# Patient Record
Sex: Male | Born: 1975 | Race: Black or African American | Hispanic: No | Marital: Single | Smoking: Never smoker
Health system: Southern US, Community
[De-identification: ages and names within clinical notes are randomized; demographics above are authoritative.]

## PROBLEM LIST (undated history)

## (undated) DIAGNOSIS — S82899A Other fracture of unspecified lower leg, initial encounter for closed fracture: Secondary | ICD-10-CM

## (undated) DIAGNOSIS — K219 Gastro-esophageal reflux disease without esophagitis: Secondary | ICD-10-CM

## (undated) DIAGNOSIS — J45909 Unspecified asthma, uncomplicated: Secondary | ICD-10-CM

## (undated) HISTORY — PX: NO PAST SURGERIES: SHX2092

---

## 1998-08-06 ENCOUNTER — Inpatient Hospital Stay (HOSPITAL_COMMUNITY): Admission: EM | Admit: 1998-08-06 | Discharge: 1998-08-08 | Payer: Self-pay | Admitting: Emergency Medicine

## 1998-08-06 ENCOUNTER — Encounter: Payer: Self-pay | Admitting: Emergency Medicine

## 2015-03-12 ENCOUNTER — Emergency Department (HOSPITAL_COMMUNITY)
Admission: EM | Admit: 2015-03-12 | Discharge: 2015-03-12 | Disposition: A | Discharge status: Home / Self Care | Payer: Self-pay | Attending: Emergency Medicine | Admitting: Emergency Medicine

## 2015-03-12 ENCOUNTER — Encounter (HOSPITAL_COMMUNITY): Payer: Self-pay

## 2015-03-12 ENCOUNTER — Emergency Department (HOSPITAL_COMMUNITY): Payer: Self-pay

## 2015-03-12 DIAGNOSIS — S82491A Other fracture of shaft of right fibula, initial encounter for closed fracture: Secondary | ICD-10-CM | POA: Insufficient documentation

## 2015-03-12 DIAGNOSIS — S82891A Other fracture of right lower leg, initial encounter for closed fracture: Secondary | ICD-10-CM

## 2015-03-12 DIAGNOSIS — J45909 Unspecified asthma, uncomplicated: Secondary | ICD-10-CM | POA: Insufficient documentation

## 2015-03-12 DIAGNOSIS — W1839XA Other fall on same level, initial encounter: Secondary | ICD-10-CM | POA: Insufficient documentation

## 2015-03-12 DIAGNOSIS — Z79899 Other long term (current) drug therapy: Secondary | ICD-10-CM | POA: Insufficient documentation

## 2015-03-12 DIAGNOSIS — Y9339 Activity, other involving climbing, rappelling and jumping off: Secondary | ICD-10-CM | POA: Insufficient documentation

## 2015-03-12 DIAGNOSIS — S82391A Other fracture of lower end of right tibia, initial encounter for closed fracture: Secondary | ICD-10-CM | POA: Insufficient documentation

## 2015-03-12 DIAGNOSIS — Y9289 Other specified places as the place of occurrence of the external cause: Secondary | ICD-10-CM | POA: Insufficient documentation

## 2015-03-12 DIAGNOSIS — S82899A Other fracture of unspecified lower leg, initial encounter for closed fracture: Secondary | ICD-10-CM

## 2015-03-12 DIAGNOSIS — Y998 Other external cause status: Secondary | ICD-10-CM | POA: Insufficient documentation

## 2015-03-12 HISTORY — DX: Unspecified asthma, uncomplicated: J45.909

## 2015-03-12 HISTORY — DX: Other fracture of unspecified lower leg, initial encounter for closed fracture: S82.899A

## 2015-03-12 MED ORDER — OXYCODONE-ACETAMINOPHEN 5-325 MG PO TABS: 1.0000 | ORAL_TABLET | ORAL | Status: DC | PRN

## 2015-03-12 MED ORDER — SODIUM CHLORIDE 0.9 % IV SOLN
INTRAVENOUS | Status: DC
  Administered 2015-03-12: 15:00:00 via INTRAVENOUS

## 2015-03-12 MED ORDER — PROPOFOL 10 MG/ML IV BOLUS
0.5000 mg/kg | Freq: Once | INTRAVENOUS | Status: AC
  Administered 2015-03-12: 30 mg via INTRAVENOUS
  Filled 2015-03-12: qty 20

## 2015-03-12 MED ORDER — FENTANYL CITRATE (PF) 100 MCG/2ML IJ SOLN
100.0000 ug | Freq: Once | INTRAMUSCULAR | Status: AC
  Administered 2015-03-12: 100 ug via INTRAVENOUS
  Filled 2015-03-12: qty 2

## 2015-03-12 MED ORDER — KETAMINE HCL 10 MG/ML IJ SOLN
1.0000 mg/kg | Freq: Once | INTRAMUSCULAR | Status: AC
  Administered 2015-03-12: 60 mg via INTRAVENOUS
  Filled 2015-03-12: qty 10.9

## 2015-03-12 MED ORDER — ONDANSETRON HCL 4 MG/2ML IJ SOLN
4.0000 mg | Freq: Once | INTRAMUSCULAR | Status: AC
  Administered 2015-03-12: 4 mg via INTRAVENOUS
  Filled 2015-03-12: qty 2

## 2015-03-12 NOTE — ED Notes (Signed)
Bed: WA06 Expected date:  Expected time:  Means of arrival:  Comments: Ems fx ankle

## 2015-03-12 NOTE — ED Notes (Signed)
Verbalized understanding discharge instructions and referral.  In no acute distress.

## 2015-03-12 NOTE — ED Notes (Signed)
Pt verbalized approval to cut jeans.

## 2015-03-12 NOTE — ED Provider Notes (Signed)
CSN: [id number]     Arrival date & time 03/12/15  1305 History   First MD Initiated Contact with Patient 03/12/15 1325     Chief Complaint  Patient presents with  . Ankle Pain     (Consider location/radiation/quality/duration/timing/severity/associated sxs/prior Treatment) HPI   40 year old male with fracture dislocation right ankle. However just before arrival. Patient was jumping over a fence when he thinks his right foot got caught near the top and he subsequently fell. Denies any significant injuries/pain aside from his ankle. No head or neck or back pain. No numbness or tingling. No blood thinners. Received 100 g of fentanyl prior to arrival.  Past Medical History  Diagnosis Date  . Asthma    History reviewed. No pertinent past surgical history. History reviewed. No pertinent family history. Social History  Substance Use Topics  . Smoking status: Never Smoker   . Smokeless tobacco: None  . Alcohol Use: No    Review of Systems  All systems reviewed and negative, other than as noted in HPI.   Allergies  Review of patient's allergies indicates no known allergies.  Home Medications   Prior to Admission medications   Medication Sig Start Date End Date Taking? Authorizing Provider  albuterol (PROVENTIL HFA;VENTOLIN HFA) 108 (90 Base) MCG/ACT inhaler Inhale 2 puffs into the lungs every 6 (six) hours as needed for wheezing or shortness of breath.   Yes Historical Provider, MD  loratadine (CLARITIN) 10 MG tablet Take 10 mg by mouth daily.   Yes Historical Provider, MD   BP 137/91 mmHg  Pulse 65  Temp(Src) 97.8 F (36.6 C) (Oral)  Resp 18  Ht 5\' 8"  (1.727 m)  Wt 240 lb (108.863 kg)  BMI 36.50 kg/m2  SpO2 96% Physical Exam  Constitutional: He appears well-developed and well-nourished. No distress.  HENT:  Head: Normocephalic and atraumatic.  Eyes: Conjunctivae are normal. Right eye exhibits no discharge. Left eye exhibits no discharge.  Neck: Neck supple.   Cardiovascular: Normal rate, regular rhythm and normal heart sounds.  Exam reveals no gallop and no friction rub.   No murmur heard. Pulmonary/Chest: Effort normal and breath sounds normal. No respiratory distress.  Abdominal: Soft. He exhibits no distension. There is no tenderness.  Musculoskeletal: He exhibits no edema or tenderness.  Deformity R ankle. Foot displaced laterally and with some external rotation. Skin taunt over area of medial distal tibia. No open areas. Good DP pulse. Good cap refill in toes. Sensation intact to light touch. No proximal tib/fib tenderness.   Neurological: He is alert.  Skin: Skin is warm and dry.  Psychiatric: He has a normal mood and affect. His behavior is normal. Thought content normal.  Nursing note and vitals reviewed.   ED Course  Procedures (including critical care time)  Procedural Sedation:  Preprocedure  Pre-anesthesia/induction confirmation of laterality/correct procedure site including "time-out."  Provider confirms review of the nurses' note, allergies, medications, pertinent labs, PMH, pre-induction vital signs, pulse oximetry, pain level, and ECG (as applicable), and patient condition satisfactory for commencing with order for sedation and procedure.  Medications: Ketamine 60mg  IV           Propofol 30 mg IV  Patient tolerated procedure and procedural sedation component as expected without apparent immediate complications.  Physician confirms procedural medication orders as administered, patient was assessed by physician post-procedure, and confirms post-sedation plan of care and disposition.  Total time of sedation/monitoring: 35 minutes  Reduction of dislocation Date/Time: 3:15 PM Performed by: Raeford RazorKOHUT, Burnell Matlin Authorized by:  Raeford Razor Consent: Verbal consent obtained. Risks and benefits: risks, benefits and alternatives were discussed Consent given by: patient Required items: required blood products, implants, devices, and  special equipment available Time out: Immediately prior to procedure a "time out" was called to verify the correct patient, procedure, equipment, support staff and site/side marked as required.  Patient sedated: see above  Vitals: Vital signs were monitored during sedation. Patient tolerance: Patient tolerated the procedure well with no immediate complications. Joint: R ankle Reduction technique: traction    Labs Review Labs Reviewed - No data to display  Imaging Review No results found.   Dg Ankle 2 Views Right  03/12/2015  CLINICAL DATA:  Status post reduction of the right ankle fracture dislocation. EXAM: RIGHT ANKLE - 2 VIEW COMPARISON:  Pre reduction images FINDINGS: The talus has been reduced into near anatomic alignment tibial articular surface. There is still mild lateral subluxation of the talus measuring 3-4 mm. Medial malleolar fracture there is now only mildly displaced, 5 mm inferior and 3 mm lateral. The distal fibular fracture there is now only foreshortened by 5 mm and displaced laterally by 7 mm. There is also a posterior malleolar fracture, which is displaced superiorly by 5 mm and posteriorly by 3 mm. IMPRESSION: 1. There has been significant reduction. The talar dislocation has been reduced with only 3-4 mm of lateral subluxation persisting. Fractures have been partially reduced. Electronically Signed   By: Amie Portland M.D.   On: 03/12/2015 16:04   Dg Ankle Complete Right  03/12/2015  CLINICAL DATA:  Right all over ankle pain with swelling, majority of pain, and obvious deformity near medial malleolus today, Pt stated he was jumping over his fence and remembers landing on his back today. Pt stated he landed back first and does not remember his foot touching anything. No previous injury EXAM: RIGHT ANKLE - COMPLETE 3+ VIEW COMPARISON:  None. FINDINGS: There is a fracture dislocation of the right ankle. There fractures of the distal tibia involving the medial malleolus as well  as from the posterior lateral corner. The talus has dislocated laterally along with the distal fibula. There is an oblique non comminuted fracture of the distal fibular shaft, which is overlapped/foreshortened by 2.5 cm, with the distal fracture component also displaced anteriorly by 1 full shaft width. No other fractures. There is diffuse surrounding soft tissue swelling. The fractured margin of the medial aspect of the distal tibia indents the overlying skin. IMPRESSION: 1. Fracture dislocation of the right ankle. Talus is dislocated laterally. There are comminuted displaced fractures of the distal tibia and non comminuted mildly displaced and foreshortened fracture of the distal fibular shaft. Electronically Signed   By: Amie Portland M.D.   On: 03/12/2015 14:43   I have personally reviewed and evaluated these images and lab results as part of my medical decision-making.   EKG Interpretation None      MDM   Final diagnoses:  Fracture dislocation of ankle, right, closed, initial encounter    40 year old male with fracture dislocation of the right ankle. Closed injury. Reasonably reduced. Remains NVI. Splinted. Crutches. As needed pain medication. Orthopedic follow-up with understanding of likely surgery.     Raeford Razor, MD 03/15/15 [phone]

## 2015-03-12 NOTE — Discharge Instructions (Signed)
Cast or Splint Care °Casts and splints support injured limbs and keep bones from moving while they heal.  °HOME CARE °· Keep the cast or splint uncovered during the drying period. °¨ A plaster cast can take 24 to 48 hours to dry. °¨ A fiberglass cast will dry in less than 1 hour. °· Do not rest the cast on anything harder than a pillow for 24 hours. °· Do not put weight on your injured limb. Do not put pressure on the cast. Wait for your doctor's approval. °· Keep the cast or splint dry. °¨ Cover the cast or splint with a plastic bag during baths or wet weather. °¨ If you have a cast over your chest and belly (trunk), take sponge baths until the cast is taken off. °¨ If your cast gets wet, dry it with a towel or blow dryer. Use the cool setting on the blow dryer. °· Keep your cast or splint clean. Wash a dirty cast with a damp cloth. °· Do not put any objects under your cast or splint. °· Do not scratch the skin under the cast with an object. If itching is a problem, use a blow dryer on a cool setting over the itchy area. °· Do not trim or cut your cast. °· Do not take out the padding from inside your cast. °· Exercise your joints near the cast as told by your doctor. °· Raise (elevate) your injured limb on 1 or 2 pillows for the first 1 to 3 days. °GET HELP IF: °· Your cast or splint cracks. °· Your cast or splint is too tight or too loose. °· You itch badly under the cast. °· Your cast gets wet or has a soft spot. °· You have a bad smell coming from the cast. °· You get an object stuck under the cast. °· Your skin around the cast becomes red or sore. °· You have new or more pain after the cast is put on. °GET HELP RIGHT AWAY IF: °· You have fluid leaking through the cast. °· You cannot move your fingers or toes. °· Your fingers or toes turn blue or white or are cool, painful, or puffy (swollen). °· You have tingling or lose feeling (numbness) around the injured area. °· You have bad pain or pressure under the  cast. °· You have trouble breathing or have shortness of breath. °· You have chest pain. °  °This information is not intended to replace advice given to you by your health care provider. Make sure you discuss any questions you have with your health care provider. °  °Document Released: 04/27/2010 Document Revised: 08/28/2012 Document Reviewed: 07/04/2012 °Elsevier Interactive Patient Education ©2016 [employer] ° °

## 2015-03-12 NOTE — ED Notes (Addendum)
Per EMS-states patient was taken a short cut to pharmacy-was jumping fences-right foot got hung up on fence and fell on back-does not know how ankle got injured-obvious deformity-100 mg of fentanyl given in route-positive pulses, can move toes

## 2015-03-12 NOTE — Progress Notes (Signed)
EDCM spoke to patient at bedside. Patient confirms he does not have a pcp or insurance living in [place].  EDCM provided patient with contact infromation to [facility], informed patient of services there.  EDCM also provided patient with list of pcps who accept self pay patients, list of discount pharmacies and websites needymeds.org and GoodRX.com for medication assistance, phone number to inquire about the orange card, phone number to inquire about Medici[employer], phone number to inquire about the Affordable Care Act, financial resources in the community such as local churches, [employer], urban ministries, and dental assistance for uninsured patients.  Patient thankful for resources.  No further EDCM needs at this time. 

## 2015-03-19 ENCOUNTER — Encounter (HOSPITAL_BASED_OUTPATIENT_CLINIC_OR_DEPARTMENT_OTHER): Payer: Self-pay | Admitting: *Deleted

## 2015-03-19 ENCOUNTER — Other Ambulatory Visit: Payer: Self-pay | Admitting: Orthopedic Surgery

## 2015-03-25 ENCOUNTER — Encounter (HOSPITAL_BASED_OUTPATIENT_CLINIC_OR_DEPARTMENT_OTHER): Payer: Self-pay | Admitting: Anesthesiology

## 2015-03-25 ENCOUNTER — Ambulatory Visit (HOSPITAL_BASED_OUTPATIENT_CLINIC_OR_DEPARTMENT_OTHER): Payer: Self-pay | Admitting: Anesthesiology

## 2015-03-25 ENCOUNTER — Encounter (HOSPITAL_BASED_OUTPATIENT_CLINIC_OR_DEPARTMENT_OTHER)
Admission: RE | Disposition: A | Discharge status: Home / Self Care | Payer: Self-pay | Source: Ambulatory Visit | Attending: Orthopedic Surgery

## 2015-03-25 ENCOUNTER — Ambulatory Visit (HOSPITAL_BASED_OUTPATIENT_CLINIC_OR_DEPARTMENT_OTHER)
Admission: RE | Admit: 2015-03-25 | Discharge: 2015-03-25 | Disposition: A | Discharge status: Home / Self Care | Payer: Self-pay | Source: Ambulatory Visit | Attending: Orthopedic Surgery | Admitting: Orthopedic Surgery

## 2015-03-25 DIAGNOSIS — X58XXXA Exposure to other specified factors, initial encounter: Secondary | ICD-10-CM | POA: Insufficient documentation

## 2015-03-25 DIAGNOSIS — K219 Gastro-esophageal reflux disease without esophagitis: Secondary | ICD-10-CM | POA: Insufficient documentation

## 2015-03-25 DIAGNOSIS — S93431A Sprain of tibiofibular ligament of right ankle, initial encounter: Secondary | ICD-10-CM | POA: Insufficient documentation

## 2015-03-25 DIAGNOSIS — Z79899 Other long term (current) drug therapy: Secondary | ICD-10-CM | POA: Insufficient documentation

## 2015-03-25 DIAGNOSIS — S82851A Displaced trimalleolar fracture of right lower leg, initial encounter for closed fracture: Secondary | ICD-10-CM | POA: Insufficient documentation

## 2015-03-25 DIAGNOSIS — J45909 Unspecified asthma, uncomplicated: Secondary | ICD-10-CM | POA: Insufficient documentation

## 2015-03-25 HISTORY — PX: ORIF ANKLE FRACTURE: SHX5408

## 2015-03-25 HISTORY — DX: Gastro-esophageal reflux disease without esophagitis: K21.9

## 2015-03-25 HISTORY — DX: Other fracture of unspecified lower leg, initial encounter for closed fracture: S82.899A

## 2015-03-25 SURGERY — OPEN REDUCTION INTERNAL FIXATION (ORIF) ANKLE FRACTURE
Anesthesia: General | Site: Ankle | Laterality: Right

## 2015-03-25 MED ORDER — MIDAZOLAM HCL 2 MG/2ML IJ SOLN
1.0000 mg | INTRAMUSCULAR | Status: DC | PRN
  Administered 2015-03-25: 2 mg via INTRAVENOUS

## 2015-03-25 MED ORDER — PROPOFOL 10 MG/ML IV BOLUS
INTRAVENOUS | Status: AC
  Filled 2015-03-25: qty 20

## 2015-03-25 MED ORDER — CEFAZOLIN SODIUM-DEXTROSE 2-3 GM-% IV SOLR
INTRAVENOUS | Status: AC
  Filled 2015-03-25: qty 50

## 2015-03-25 MED ORDER — FENTANYL CITRATE (PF) 100 MCG/2ML IJ SOLN
INTRAMUSCULAR | Status: AC
  Filled 2015-03-25: qty 2

## 2015-03-25 MED ORDER — HYDROMORPHONE HCL 1 MG/ML IJ SOLN
1.0000 mg | INTRAMUSCULAR | Status: DC | PRN
  Administered 2015-03-25: 0.5 mg via INTRAVENOUS

## 2015-03-25 MED ORDER — HYDROMORPHONE HCL 1 MG/ML IJ SOLN
INTRAMUSCULAR | Status: AC
  Filled 2015-03-25: qty 1

## 2015-03-25 MED ORDER — ONDANSETRON HCL 4 MG/2ML IJ SOLN
INTRAMUSCULAR | Status: AC
  Filled 2015-03-25: qty 2

## 2015-03-25 MED ORDER — GLYCOPYRROLATE 0.2 MG/ML IJ SOLN: 0.2000 mg | Freq: Once | INTRAMUSCULAR | Status: DC | PRN

## 2015-03-25 MED ORDER — ONDANSETRON HCL 4 MG/2ML IJ SOLN
INTRAMUSCULAR | Status: DC | PRN
  Administered 2015-03-25: 4 mg via INTRAVENOUS

## 2015-03-25 MED ORDER — LIDOCAINE HCL (CARDIAC) 20 MG/ML IV SOLN
INTRAVENOUS | Status: AC
  Filled 2015-03-25: qty 5

## 2015-03-25 MED ORDER — 0.9 % SODIUM CHLORIDE (POUR BTL) OPTIME
TOPICAL | Status: DC | PRN
  Administered 2015-03-25: 200 mL

## 2015-03-25 MED ORDER — LACTATED RINGERS IV SOLN
INTRAVENOUS | Status: DC
  Administered 2015-03-25: 10:00:00 via INTRAVENOUS
  Administered 2015-03-25: 10 mL/h via INTRAVENOUS

## 2015-03-25 MED ORDER — SODIUM CHLORIDE 0.9 % IV SOLN: INTRAVENOUS | Status: DC

## 2015-03-25 MED ORDER — BUPIVACAINE-EPINEPHRINE (PF) 0.5% -1:200000 IJ SOLN
INTRAMUSCULAR | Status: DC | PRN
  Administered 2015-03-25 (×2): 10 mL via PERINEURAL

## 2015-03-25 MED ORDER — FENTANYL CITRATE (PF) 100 MCG/2ML IJ SOLN
50.0000 ug | INTRAMUSCULAR | Status: AC | PRN
  Administered 2015-03-25: 100 ug via INTRAVENOUS
  Administered 2015-03-25 (×2): 25 ug via INTRAVENOUS

## 2015-03-25 MED ORDER — LIDOCAINE HCL (CARDIAC) 20 MG/ML IV SOLN
INTRAVENOUS | Status: DC | PRN
  Administered 2015-03-25: 100 mg via INTRAVENOUS

## 2015-03-25 MED ORDER — MIDAZOLAM HCL 2 MG/2ML IJ SOLN
INTRAMUSCULAR | Status: AC
  Filled 2015-03-25: qty 2

## 2015-03-25 MED ORDER — CHLORHEXIDINE GLUCONATE 4 % EX LIQD: 60.0000 mL | Freq: Once | CUTANEOUS | Status: DC

## 2015-03-25 MED ORDER — SCOPOLAMINE 1 MG/3DAYS TD PT72: 1.0000 | MEDICATED_PATCH | Freq: Once | TRANSDERMAL | Status: DC | PRN

## 2015-03-25 MED ORDER — CEFAZOLIN SODIUM-DEXTROSE 2-3 GM-% IV SOLR
2.0000 g | INTRAVENOUS | Status: AC
  Administered 2015-03-25: 2 g via INTRAVENOUS

## 2015-03-25 MED ORDER — DEXAMETHASONE SODIUM PHOSPHATE 10 MG/ML IJ SOLN
INTRAMUSCULAR | Status: AC
  Filled 2015-03-25: qty 1

## 2015-03-25 MED ORDER — DEXAMETHASONE SODIUM PHOSPHATE 4 MG/ML IJ SOLN
INTRAMUSCULAR | Status: DC | PRN
  Administered 2015-03-25: 10 mg via INTRAVENOUS

## 2015-03-25 MED ORDER — SUCCINYLCHOLINE CHLORIDE 20 MG/ML IJ SOLN
INTRAMUSCULAR | Status: AC
  Filled 2015-03-25: qty 1

## 2015-03-25 MED ORDER — OXYCODONE HCL 5 MG PO TABS: 5.0000 mg | ORAL_TABLET | ORAL | Status: AC | PRN

## 2015-03-25 MED ORDER — ROPIVACAINE HCL 5 MG/ML IJ SOLN
INTRAMUSCULAR | Status: DC | PRN
  Administered 2015-03-25 (×2): 10 mL via PERINEURAL

## 2015-03-25 MED ORDER — PROPOFOL 10 MG/ML IV BOLUS
INTRAVENOUS | Status: DC | PRN
  Administered 2015-03-25: 300 mg via INTRAVENOUS

## 2015-03-25 SURGICAL SUPPLY — 74 items
BANDAGE ESMARK 6X9 LF (GAUZE/BANDAGES/DRESSINGS) ×1 IMPLANT
BIT DRILL 2.5X2.75 QC CALB (BIT) ×2 IMPLANT
BIT DRILL 3.5X5.5 QC CALB (BIT) ×2 IMPLANT
BLADE SURG 15 STRL LF DISP TIS (BLADE) ×1 IMPLANT
BLADE SURG 15 STRL SS (BLADE) ×1
BNDG COHESIVE 4X5 TAN STRL (GAUZE/BANDAGES/DRESSINGS) ×2 IMPLANT
BNDG COHESIVE 6X5 TAN STRL LF (GAUZE/BANDAGES/DRESSINGS) ×2 IMPLANT
BNDG ESMARK 4X9 LF (GAUZE/BANDAGES/DRESSINGS) IMPLANT
BNDG ESMARK 6X9 LF (GAUZE/BANDAGES/DRESSINGS) ×2
CANISTER SUCT 1200ML W/VALVE (MISCELLANEOUS) ×2 IMPLANT
CHLORAPREP W/TINT 26ML (MISCELLANEOUS) ×2 IMPLANT
COVER BACK TABLE 60X90IN (DRAPES) ×2 IMPLANT
CUFF TOURNIQUET SINGLE 34IN LL (TOURNIQUET CUFF) ×2 IMPLANT
DECANTER SPIKE VIAL GLASS SM (MISCELLANEOUS) IMPLANT
DRAPE EXTREMITY T 121X128X90 (DRAPE) ×2 IMPLANT
DRAPE OEC MINIVIEW 54X84 (DRAPES) ×2 IMPLANT
DRAPE U-SHAPE 47X51 STRL (DRAPES) ×2 IMPLANT
DRSG MEPITEL 4X7.2 (GAUZE/BANDAGES/DRESSINGS) ×2 IMPLANT
DRSG PAD ABDOMINAL 8X10 ST (GAUZE/BANDAGES/DRESSINGS) ×4 IMPLANT
ELECT REM PT RETURN 9FT ADLT (ELECTROSURGICAL) ×2
ELECTRODE REM PT RTRN 9FT ADLT (ELECTROSURGICAL) ×1 IMPLANT
GAUZE SPONGE 4X4 12PLY STRL (GAUZE/BANDAGES/DRESSINGS) ×2 IMPLANT
GLOVE BIO SURGEON STRL SZ8 (GLOVE) ×2 IMPLANT
GLOVE BIOGEL PI IND STRL 7.0 (GLOVE) ×1 IMPLANT
GLOVE BIOGEL PI IND STRL 8 (GLOVE) ×2 IMPLANT
GLOVE BIOGEL PI INDICATOR 7.0 (GLOVE) ×1
GLOVE BIOGEL PI INDICATOR 8 (GLOVE) ×2
GLOVE ECLIPSE 6.5 STRL STRAW (GLOVE) ×2 IMPLANT
GLOVE ECLIPSE 7.5 STRL STRAW (GLOVE) ×2 IMPLANT
GLOVE EXAM NITRILE MD LF STRL (GLOVE) ×2 IMPLANT
GOWN STRL REUS W/ TWL LRG LVL3 (GOWN DISPOSABLE) ×1 IMPLANT
GOWN STRL REUS W/ TWL XL LVL3 (GOWN DISPOSABLE) ×2 IMPLANT
GOWN STRL REUS W/TWL LRG LVL3 (GOWN DISPOSABLE) ×1
GOWN STRL REUS W/TWL XL LVL3 (GOWN DISPOSABLE) ×2
K-WIRE ACE 1.6X6 (WIRE) ×4
KWIRE ACE 1.6X6 (WIRE) ×2 IMPLANT
NEEDLE HYPO 22GX1.5 SAFETY (NEEDLE) IMPLANT
NS IRRIG 1000ML POUR BTL (IV SOLUTION) ×2 IMPLANT
PACK BASIN DAY SURGERY FS (CUSTOM PROCEDURE TRAY) ×2 IMPLANT
PAD CAST 4YDX4 CTTN HI CHSV (CAST SUPPLIES) ×1 IMPLANT
PADDING CAST ABS 4INX4YD NS (CAST SUPPLIES)
PADDING CAST ABS COTTON 4X4 ST (CAST SUPPLIES) IMPLANT
PADDING CAST COTTON 4X4 STRL (CAST SUPPLIES) ×1
PADDING CAST COTTON 6X4 STRL (CAST SUPPLIES) ×2 IMPLANT
PENCIL BUTTON HOLSTER BLD 10FT (ELECTRODE) ×2 IMPLANT
PLATE ACE 100DE 10H (Plate) ×2 IMPLANT
SANITIZER HAND PURELL 535ML FO (MISCELLANEOUS) ×2 IMPLANT
SCREW ACE CAN 4.0 40M (Screw) ×4 IMPLANT
SCREW CORTICAL 3.5MM  16MM (Screw) ×5 IMPLANT
SCREW CORTICAL 3.5MM  20MM (Screw) ×1 IMPLANT
SCREW CORTICAL 3.5MM 16MM (Screw) ×5 IMPLANT
SCREW CORTICAL 3.5MM 18MM (Screw) ×2 IMPLANT
SCREW CORTICAL 3.5MM 20MM (Screw) ×1 IMPLANT
SHEET MEDIUM DRAPE 40X70 STRL (DRAPES) ×2 IMPLANT
SLEEVE SCD COMPRESS KNEE MED (MISCELLANEOUS) ×2 IMPLANT
SPLINT FAST PLASTER 5X30 (CAST SUPPLIES) ×20
SPLINT PLASTER CAST FAST 5X30 (CAST SUPPLIES) ×20 IMPLANT
SPONGE LAP 18X18 X RAY DECT (DISPOSABLE) ×2 IMPLANT
STOCKINETTE 6  STRL (DRAPES) ×1
STOCKINETTE 6 STRL (DRAPES) ×1 IMPLANT
SUCTION FRAZIER HANDLE 10FR (MISCELLANEOUS) ×1
SUCTION TUBE FRAZIER 10FR DISP (MISCELLANEOUS) ×1 IMPLANT
SUT ETHILON 3 0 PS 1 (SUTURE) ×2 IMPLANT
SUT FIBERWIRE #2 38 T-5 BLUE (SUTURE)
SUT MNCRL AB 3-0 PS2 18 (SUTURE) ×2 IMPLANT
SUT VIC AB 0 SH 27 (SUTURE) IMPLANT
SUT VIC AB 2-0 SH 27 (SUTURE) ×1
SUT VIC AB 2-0 SH 27XBRD (SUTURE) ×1 IMPLANT
SUTURE FIBERWR #2 38 T-5 BLUE (SUTURE) IMPLANT
SYR BULB 3OZ (MISCELLANEOUS) ×2 IMPLANT
SYR CONTROL 10ML LL (SYRINGE) IMPLANT
TOWEL OR 17X24 6PK STRL BLUE (TOWEL DISPOSABLE) ×4 IMPLANT
TUBE CONNECTING 20X1/4 (TUBING) ×2 IMPLANT
UNDERPAD 30X30 (UNDERPADS AND DIAPERS) ×2 IMPLANT

## 2015-03-25 NOTE — H&P (Signed)
Ralph BjorkJason L Levy is an 40 y.o. male.   Chief Complaint: right ankle injury HPI: 40 y/o male with right ankle fracture dislocation.  Pt underwent closed reduction in the ER and presents now for ORIF.  No recent changes in his health.  Past Medical History  Diagnosis Date  . GERD (gastroesophageal reflux disease)     OTC as needed  . Asthma     prn inhaler  . Fracture dislocation of ankle 03/12/2015    right    Past Surgical History  Procedure Laterality Date  . No past surgeries      History reviewed. No pertinent family history. Social History:  reports that he has never smoked. He has never used smokeless tobacco. He reports that he does not drink alcohol or use illicit drugs.  Allergies:  Allergies  Allergen Reactions  . Shellfish Allergy Anaphylaxis    Medications Prior to Admission  Medication Sig Dispense Refill  . acetaminophen (TYLENOL) 325 MG tablet Take 650 mg by mouth every 6 (six) hours as needed.    Marland Kitchen. albuterol (PROVENTIL HFA;VENTOLIN HFA) 108 (90 Base) MCG/ACT inhaler Inhale 2 puffs into the lungs every 6 (six) hours as needed for wheezing or shortness of breath.    Marland Kitchen. ibuprofen (ADVIL,MOTRIN) 200 MG tablet Take 200 mg by mouth every 6 (six) hours as needed.    . loratadine (CLARITIN) 10 MG tablet Take 10 mg by mouth daily.    Marland Kitchen. oxyCODONE-acetaminophen (PERCOCET/ROXICET) 5-325 MG tablet Take 1-2 tablets by mouth every 4 (four) hours as needed for severe pain. 20 tablet 0    No results found for this or any previous visit (from the past 48 hour(s)). No results found.  ROS  No recent f/c/n/v/wt loss  Blood pressure 115/81, pulse 85, temperature 99.4 F (37.4 C), temperature source Oral, resp. rate 8, height 5\' 7"  (1.702 m), weight 111.642 kg (246 lb 2 oz), SpO2 99 %. Physical Exam  wn wd male in nad.  A and O x 4.  Mood and affect normal.  EOMI.  resp unlabored.  R ankle with heatlhy skin.  No lymphadenopathy.  Mild swelling.  5/5 strength in PF and DF of the toes  and ankle.  Sens to LT intact at the foot.  Assessment/Plan R ankle trimal fracture with syndesmosis disruption - to OR for ORIF.  The risks and benefits of the alternative treatment options have been discussed in detail.  The patient wishes to proceed with surgery and specifically understands risks of bleeding, infection, nerve damage, blood clots, need for additional surgery, amputation and death.   Toni ArthursHEWITT, Ralph Krist, MD 03/25/2015, 11:14 AM

## 2015-03-25 NOTE — Anesthesia Postprocedure Evaluation (Signed)
Anesthesia Post Note  Patient: Ralph Levy  Procedure(s) Performed: Procedure(s) (LRB): OPEN REDUCTION INTERNAL FIXATION (ORIF) RIGHT ANKLE TRIMALLEOLAR FRACTURE AND SYNDESMOSIS DISRUPTION  (Right)  Patient location during evaluation: PACU Anesthesia Type: General Level of consciousness: awake and alert Pain management: pain level controlled Vital Signs Assessment: post-procedure vital signs reviewed and stable Respiratory status: spontaneous breathing, nonlabored ventilation and respiratory function stable Cardiovascular status: blood pressure returned to baseline and stable Postop Assessment: no signs of nausea or vomiting Anesthetic complications: no    Last Vitals:  Filed Vitals:   03/25/15 1415 03/25/15 1430  BP: 132/87 125/86  Pulse: 93 90  Temp:    Resp: 16 15    Last Pain:  Filed Vitals:   03/25/15 1442  PainSc: 1                  Matei Magnone A

## 2015-03-25 NOTE — Discharge Instructions (Addendum)
Toni ArthursJohn Hewitt, MD [facility]  Please read the following information regarding your care after surgery.  Medications  You only need a prescription for the narcotic pain medicine (ex. oxycodone, Percocet, Norco).  All of the other medicines listed below are available over the counter. X acetominophen (Tylenol) 650 mg every 4-6 hours as you need for minor pain X oxycodone as prescribed for moderate to severe pain  Narcotic pain medicine (ex. oxycodone, Percocet, Vicodin) will cause constipation.  To prevent this problem, take the following medicines while you are taking any pain medicine. X docusate sodium (Colace) 100 mg twice a day X senna (Senokot) 2 tablets twice a day  X To help prevent blood clots, take a baby aspirin (81 mg) twice a day for a month after surgery.  You should also get up every hour while you are awake to move around.    Weight Bearing ? Bear weight when you are able on your operated leg or foot. ? Bear weight only on the heel of your operated foot in the post-op shoe. X Do not bear any weight on the operated leg or foot.  Cast / Splint / Dressing X Keep your splint or cast clean and dry.  Dont put anything (coat hanger, pencil, etc) down inside of it.  If it gets damp, use a hair dryer on the cool setting to dry it.  If it gets soaked, call the office to schedule an appointment for a cast change. ? Remove your dressing 3 days after surgery and cover the incisions with dry dressings.    After your dressing, cast or splint is removed; you may shower, but do not soak or scrub the wound.  Allow the water to run over it, and then gently pat it dry.  Swelling It is normal for you to have swelling where you had surgery.  To reduce swelling and pain, keep your toes above your nose for at least 3 days after surgery.  It may be necessary to keep your foot or leg elevated for several weeks.  If it hurts, it should be elevated.  Follow Up Call my office at  [phone] when you are discharged from the hospital or surgery center to schedule an appointment to be seen two weeks after surgery.  Call my office at [phone] if you develop a fever >101.5 F, nausea, vomiting, bleeding from the surgical site or severe pain.     Regional Anesthesia Blocks  1. Numbness or the inability to move the "blocked" extremity may last from 3-48 hours after placement. The length of time depends on the medication injected and your individual response to the medication. If the numbness is not going away after 48 hours, call your surgeon.  2. The extremity that is blocked will need to be protected until the numbness is gone and the  Strength has returned. Because you cannot feel it, you will need to take extra care to avoid injury. Because it may be weak, you may have difficulty moving it or using it. You may not know what position it is in without looking at it while the block is in effect.  3. For blocks in the legs and feet, returning to weight bearing and walking needs to be done carefully. You will need to wait until the numbness is entirely gone and the strength has returned. You should be able to move your leg and foot normally before you try and bear weight or walk. You will need someone to be with  you when you first try to ensure you do not fall and possibly risk injury.  4. Bruising and tenderness at the needle site are common side effects and will resolve in a few days.  5. Persistent numbness or new problems with movement should be communicated to the surgeon or the [place] [phone] [place] [phone]).     Post Anesthesia Home Care Instructions  Activity: Get plenty of rest for the remainder of the day. A responsible adult should stay with you for 24 hours following the procedure.  For the next 24 hours, DO NOT: -Drive a car -Paediatric nurse -Drink alcoholic beverages -Take any medication unless  instructed by your physician -Make any legal decisions or sign important papers.  Meals: Start with liquid foods such as gelatin or soup. Progress to regular foods as tolerated. Avoid greasy, spicy, heavy foods. If nausea and/or vomiting occur, drink only clear liquids until the nausea and/or vomiting subsides. Call your physician if vomiting continues.  Special Instructions/Symptoms: Your throat may feel dry or sore from the anesthesia or the breathing tube placed in your throat during surgery. If this causes discomfort, gargle with warm salt water. The discomfort should disappear within 24 hours.  If you had a scopolamine patch placed behind your ear for the management of post- operative nausea and/or vomiting:  1. The medication in the patch is effective for 72 hours, after which it should be removed.  Wrap patch in a tissue and discard in the trash. Wash hands thoroughly with soap and water. 2. You may remove the patch earlier than 72 hours if you experience unpleasant side effects which may include dry mouth, dizziness or visual disturbances. 3. Avoid touching the patch. Wash your hands with soap and water after contact with the patch.

## 2015-03-25 NOTE — Transfer of Care (Signed)
Immediate Anesthesia Transfer of Care Note  Patient: Ralph Levy  Procedure(s) Performed: Procedure(s): OPEN REDUCTION INTERNAL FIXATION (ORIF) RIGHT ANKLE TRIMALLEOLAR FRACTURE AND SYNDESMOSIS DISRUPTION  (Right)  Patient Location: PACU  Anesthesia Type:GA combined with regional for post-op pain  Level of Consciousness: sedated, lethargic and responds to stimulation  Airway & Oxygen Therapy: Patient Spontanous Breathing and Patient connected to face mask oxygen  Post-op Assessment: Report given to RN and Post -op Vital signs reviewed and stable  Post vital signs: Reviewed and stable  Last Vitals:  Filed Vitals:   03/25/15 1045 03/25/15 1304  BP: 115/81 124/76  Pulse: 85   Temp:    Resp: 8     Complications: No apparent anesthesia complications

## 2015-03-25 NOTE — Progress Notes (Signed)
Assisted Dr. Crews with right, ultrasound guided, popliteal/saphenous block. Side rails up, monitors on throughout procedure. See vital signs in flow sheet. Tolerated Procedure well. 

## 2015-03-25 NOTE — Op Note (Signed)
NAMEMarland Levy  Ralph, Levy NO.:  [id number]  MEDICAL RECORD NO.:  [id number]  LOCATION:                                 FACILITY:  PHYSICIAN:  Toni Arthurs, MD        DATE OF BIRTH:  09-02-1975  DATE OF PROCEDURE:  03/25/2015 DATE OF DISCHARGE:                              OPERATIVE REPORT   PREOPERATIVE DIAGNOSIS:  Right ankle trimalleolar fracture dislocation with syndesmosis disruption.  POSTOPERATIVE DIAGNOSIS:  Right ankle trimalleolar fracture dislocation with syndesmosis disruption.  PROCEDURE: 1. Open reduction and internal fixation of right ankle trimalleolar     fracture without fixation of posterior lip. 2. Stress examination of the right ankle under fluoroscopy. 3. AP, mortise, and lateral radiographs of the right ankle.  SURGEON:  Toni Arthurs, MD.  ANESTHESIA:  General, regional.  ESTIMATED BLOOD LOSS:  Minimal.  TOURNIQUET TIME:  65 minutes at 250 mmHg.  COMPLICATIONS:  None apparent.  DISPOSITION:  Extubated, awake, and stable to recovery.  INDICATION FOR PROCEDURE:  The patient is a 40 year old male, who fractured his right ankle approximately 2 weeks ago.  He was seen in the emergency department where a closed reduction was performed.  He presents now for open reduction and internal fixation of this unstable ankle injury.  He understands the risks and benefits, the alternative treatment options, and elects surgical treatment.  He specifically understands risks of bleeding, infection, nerve damage, blood clots, need for additional surgery, continued pain, nonunion, amputation, and death.  PROCEDURE IN DETAIL:  After preoperative consent was obtained and the correct operative site was identified, the patient was brought to the operating room and placed supine on the operating table.  General anesthesia was induced.  Preoperative antibiotics were administered. Surgical time-out was taken.  The right lower extremity was prepped and draped  in standard sterile fashion with a tourniquet around the thigh. The extremity was exsanguinated, and the tourniquet was inflated to 250 mmHg.  A longitudinal incision was then made over the lateral malleolus. Sharp dissection was carried down through the skin and subcutaneous tissue.  The fracture site was identified.  It was cleaned of all hematoma and irrigated copiously.  The fracture was reduced and held with a K-wire.  A 10 hole one-third tubular plate from the [employer] small frag set was then applied to the lateral malleolus.  It was fixed proximally and distally with unicortical screw.  A lag screw was then inserted through the plate and through the fracture site.  It was noted to have excellent purchase.  Two more screws proximally and one more screw distally were inserted.  Attention was then turned to the medial side of the ankle where a longitudinal incision was made over the medial malleolus.  Sharp dissection was carried down through skin and subcutaneous tissue.  The fracture site was identified.  It was cleaned of all hematoma and irrigated copiously.  The fracture was reduced and held with a pointed tenaculum.  AP, mortise, and lateral radiographs confirmed appropriate reduction of the medial malleolus fracture.  Fracture was then fixed with two 4-mm partially-threaded cannulated screws.  These were both noted to have excellent purchase.  AP and  lateral radiographs confirmed appropriate reduction of both fractures and appropriate position of all hardware.  At this point, a stress examination was performed under live fluoroscopy.  There was no widening evident at the ankle mortise or syndesmosis.  Final AP, mortise, and lateral radiographs confirmed appropriate reduction of medial and lateral malleolus fractures. Posterior malleolus fracture was quite comminuted and was slightly displaced but did not involve a material amount of the articular surface.  Both wounds were then  irrigated copiously.  Deep subcutaneous tissues were approximated with inverted simple sutures of 2-0 Vicryl, superficial subcutaneous tissues were approximated with inverted simple sutures of 3-0 Monocryl, skin incisions were closed with running 3-0 nylon sutures.  Sterile dressings were applied, followed by well-padded short-leg splint.  Tourniquet was released after application of the dressings at 65 minutes.  The patient was awakened from anesthesia and transported to the recovery room in stable condition.  FOLLOWUP PLAN:  The patient will be nonweightbearing on the left lower extremity.  He will take aspirin for DVT prophylaxis.  He will follow up with me in the office in 2 weeks for suture removal and conversion to a cast.  Alfredo MartinezJustin Ollis, PA-C was present and scrubbed for the duration of the case.  His assistance was essential in positioning the patient, prepping and draping, gaining and maintaining exposure, performing the operation, closing and dressing the wounds, and applying the splint.  RADIOGRAPHS:  AP, mortise, and lateral radiographs of the right ankle were obtained intraoperatively.  These showed interval reduction and fixation of the medial and lateral malleolus fractures.  Hardware was appropriately positioned of the appropriate length.  No instability was noted at the syndesmosis.     Toni ArthursJohn Shandricka Monroy, MD     JH/MEDQ  D:  03/25/2015  T:  03/25/2015  Job:  [id number]

## 2015-03-25 NOTE — Brief Op Note (Signed)
03/25/2015  1:05 PM  PATIENT:  Ralph Levy  40 y.o. male  PRE-OPERATIVE DIAGNOSIS:  RIGHT ANKLE trimal FRACTURE DISLOCATION with syndesmosis disruption  POST-OPERATIVE DIAGNOSIS:  same  Procedure(s): 1.  ORIF right ankle trimal fracture without fixation of posterior lip   2.  Stress exam of right ankle under fluoro   3.  AP, mortise and lat xrays of the right ankle  SURGEON:  Toni ArthursJohn Sheera Illingworth, MD  ASSISTANT: Alfredo MartinezJustin Ollis, PA-C  ANESTHESIA:   General, regional  EBL:  minimal   TOURNIQUET:   Total Tourniquet Time Documented: Thigh (Right) - 65 minutes Total: Thigh (Right) - 65 minutes  COMPLICATIONS:  None apparent  DISPOSITION:  Extubated, awake and stable to recovery.  DICTATION ID:  [id number]

## 2015-03-25 NOTE — Anesthesia Procedure Notes (Addendum)
Anesthesia Regional Block:  Popliteal block  Pre-Anesthetic Checklist: ,, timeout performed, Correct Patient, Correct Site, Correct Laterality, Correct Procedure, Correct Position, site marked, Risks and benefits discussed,  Surgical consent,  Pre-op evaluation,  At surgeon's request and post-op pain management  Laterality: Lower and Right  Prep: chloraprep       Needles:  Injection technique: Single-shot  Needle Type: Echogenic Needle     Needle Length: 9cm 9 cm Needle Gauge: 21 and 21 G    Additional Needles:  Procedures: ultrasound guided (picture in chart) Popliteal block Narrative:  Start time: 03/25/2015 10:10 AM End time: 03/25/2015 10:14 AM Injection made incrementally with aspirations every 5 mL.  Performed by: Personally  Anesthesiologist: Jhaden Pizzuto   Anesthesia Regional Block:  Adductor canal block  Pre-Anesthetic Checklist: ,, timeout performed, Correct Patient, Correct Site, Correct Laterality, Correct Procedure, Correct Position, site marked, Risks and benefits discussed,  Surgical consent,  Pre-op evaluation,  At surgeon's request and post-op pain management  Laterality: Lower and Right  Prep: chloraprep       Needles:  Injection technique: Single-shot  Needle Type: Echogenic Needle     Needle Length: 9cm 9 cm Needle Gauge: 21 and 21 G    Additional Needles:  Procedures: ultrasound guided (picture in chart) Adductor canal block Narrative:  Start time: 03/25/2015 10:14 AM End time: 03/25/2015 10:19 AM Injection made incrementally with aspirations every 5 mL.  Performed by: Personally  Anesthesiologist: Raziel Koenigs   Procedure Name: LMA Insertion Date/Time: 03/25/2015 11:30 AM Performed by: Gar GibbonKEETON, DENNIS S Pre-anesthesia Checklist: Patient identified, Emergency Drugs available, Suction available and Patient being monitored Patient Re-evaluated:Patient Re-evaluated prior to inductionOxygen Delivery Method: Circle System  Utilized Preoxygenation: Pre-oxygenation with 100% oxygen Intubation Type: IV induction Ventilation: Mask ventilation without difficulty LMA: LMA inserted LMA Size: 4.0 Number of attempts: 1 Airway Equipment and Method: Bite block Placement Confirmation: positive ETCO2 Tube secured with: Tape Dental Injury: Teeth and Oropharynx as per pre-operative assessment       Right Popliteal block image  Right saphenous block image

## 2015-03-25 NOTE — Anesthesia Preprocedure Evaluation (Addendum)
Anesthesia Evaluation  Patient identified by MRN, date of birth, ID band Patient awake    Reviewed: Allergy & Precautions, NPO status , Patient's Chart, lab work & pertinent test results  Airway Mallampati: I  TM Distance: >3 FB Neck ROM: Full    Dental  (+) Teeth Intact, Dental Advisory Given   Pulmonary asthma ,    breath sounds clear to auscultation       Cardiovascular  Rhythm:Regular Rate:Normal     Neuro/Psych    GI/Hepatic GERD  Medicated and Controlled,  Endo/Other    Renal/GU      Musculoskeletal   Abdominal   Peds  Hematology   Anesthesia Other Findings   Reproductive/Obstetrics                             Anesthesia Physical Anesthesia Plan  ASA: II  Anesthesia Plan: General   Post-op Pain Management: MAC Combined w/ Regional for Post-op pain   Induction: Intravenous  Airway Management Planned: LMA  Additional Equipment:   Intra-op Plan:   Post-operative Plan: Extubation in OR  Informed Consent: I have reviewed the patients History and Physical, chart, labs and discussed the procedure including the risks, benefits and alternatives for the proposed anesthesia with the patient or authorized representative who has indicated his/her understanding and acceptance.   Dental advisory given  Plan Discussed with: CRNA, Anesthesiologist and Surgeon  Anesthesia Plan Comments:         Anesthesia Quick Evaluation

## 2015-03-26 ENCOUNTER — Encounter (HOSPITAL_BASED_OUTPATIENT_CLINIC_OR_DEPARTMENT_OTHER): Payer: Self-pay | Admitting: Orthopedic Surgery

## 2015-03-30 NOTE — Addendum Note (Signed)
Addendum  created 03/30/15 1012 by Sheldon Silvanavid Hubert Derstine, MD   Modules edited: Anesthesia Blocks and Procedures, Clinical Notes   Clinical Notes:  File: [id number]; File: [id number]; File: [id number]

## 2017-05-30 IMAGING — CR DG ANKLE COMPLETE 3+V*R*
3 series · 3 of 3 positions shown · non-contrast
Comparison: None.

CLINICAL DATA: Right all over ankle pain with swelling, majority of
pain, and obvious deformity near medial malleolus today,
Pt stated he was jumping over his fence and remembers landing on his
back today. Pt stated he landed back first and does not remember his
foot touching anything.
No previous injury

EXAM:
RIGHT ANKLE - COMPLETE 3+ VIEW

[x ankle lat right]
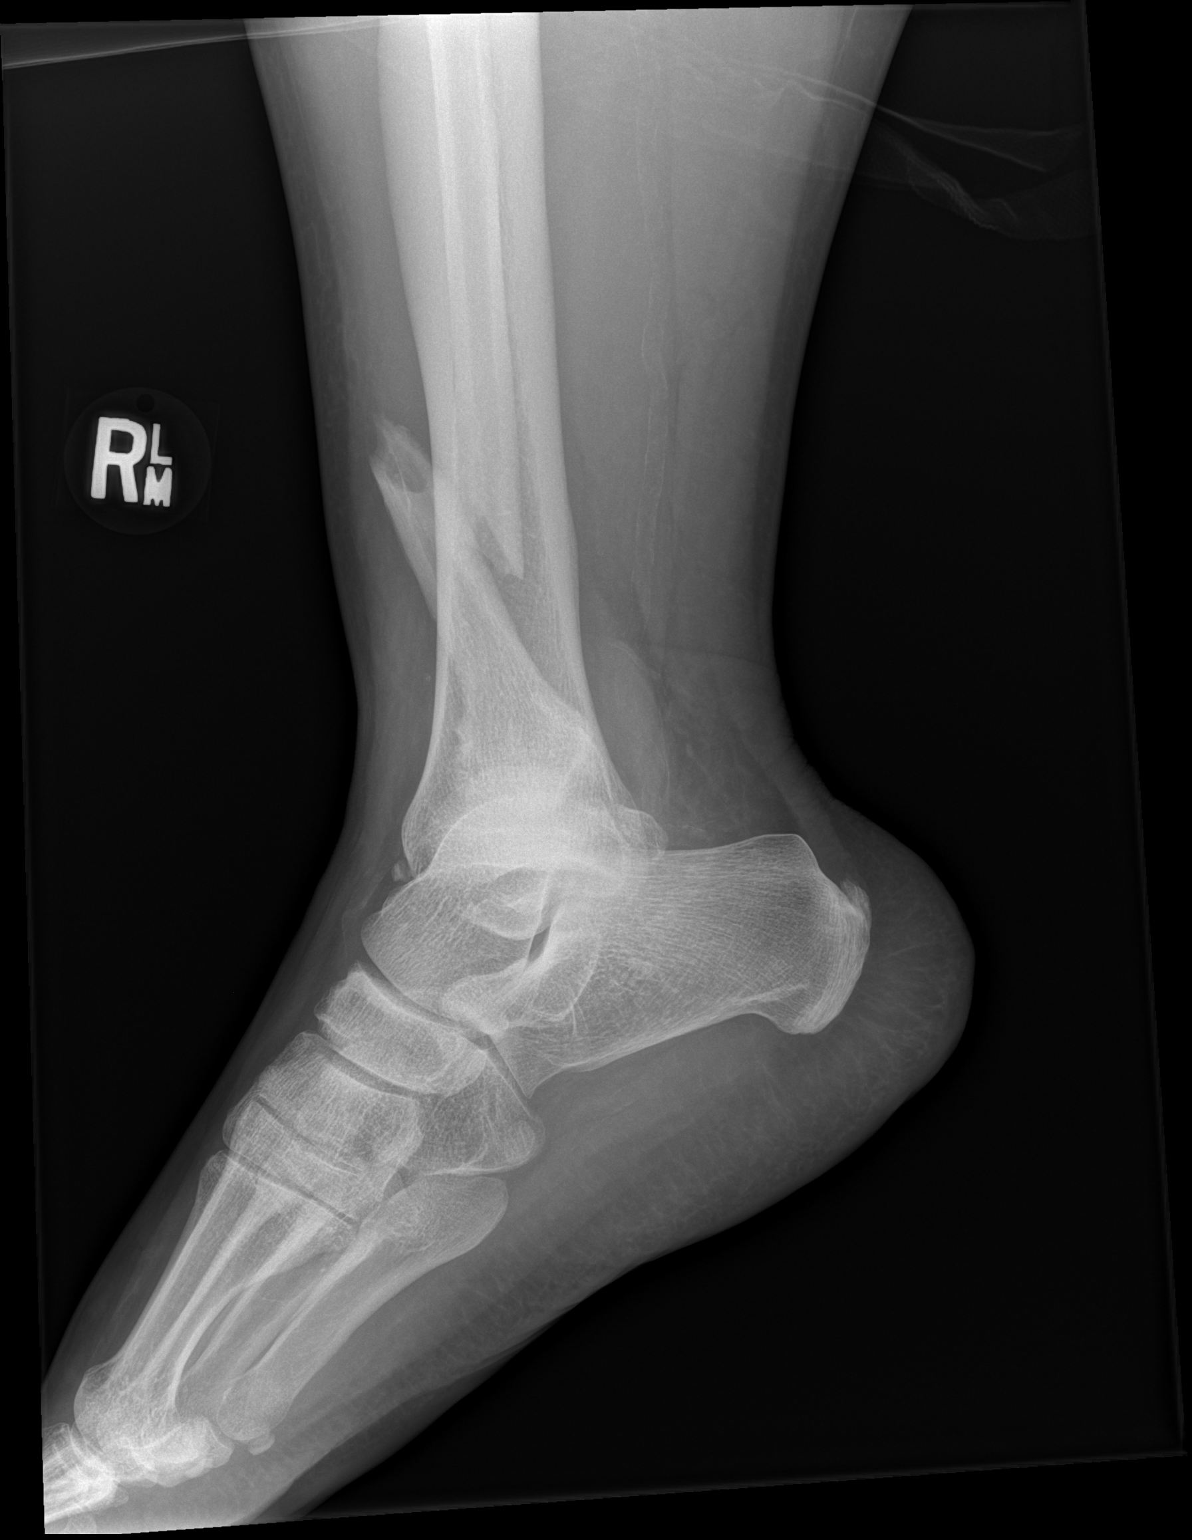

[x ankle ap right]
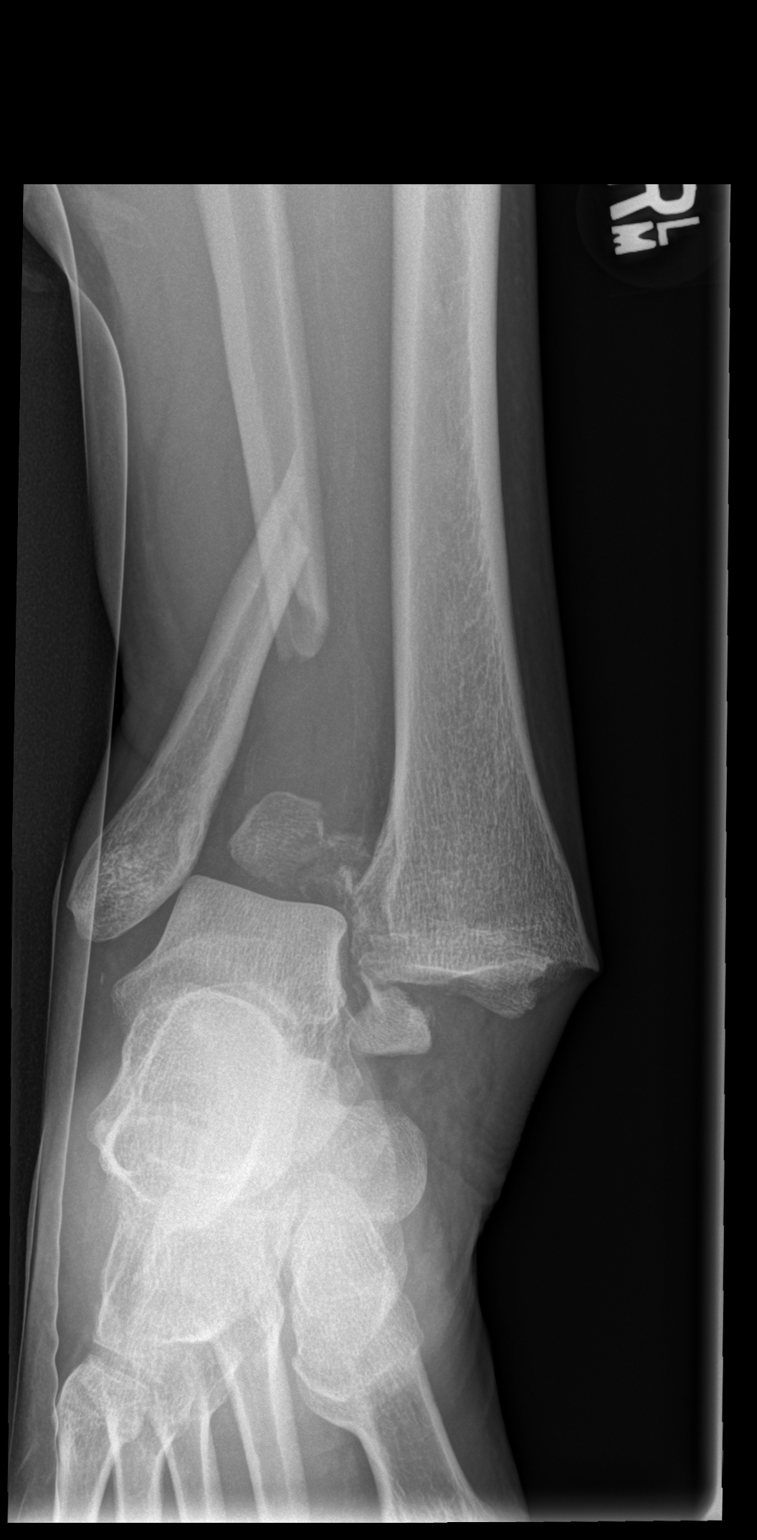

[x ankle obl right]
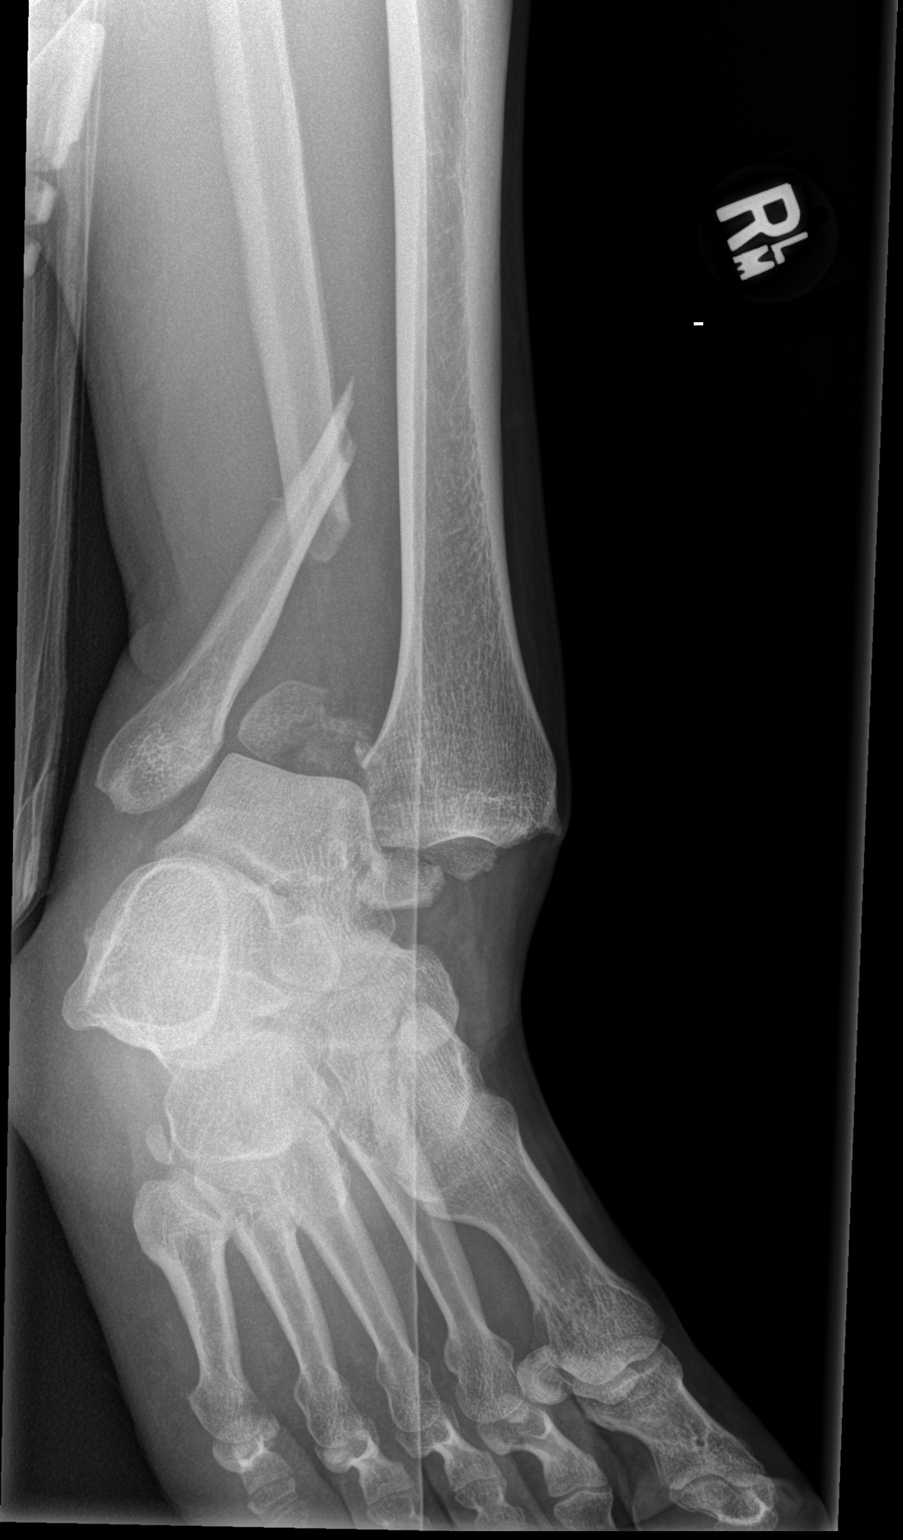

[3 of 3 positions shown; findings below may reference images not displayed]

FINDINGS: There is a fracture dislocation of the right ankle.

There fractures of the distal tibia involving the medial malleolus
as well as from the posterior lateral corner. The talus has
dislocated laterally along with the distal fibula. There is an
oblique non comminuted fracture of the distal fibular shaft, which
is overlapped/foreshortened by 2.5 cm, with the distal fracture
component also displaced anteriorly by 1 full shaft width.

No other fractures. There is diffuse surrounding soft tissue
swelling.

The fractured margin of the medial aspect of the distal tibia
indents the overlying skin.
IMPRESSION: 1. Fracture dislocation of the right ankle. Talus is dislocated
laterally. There are comminuted displaced fractures of the distal
tibia and non comminuted mildly displaced and foreshortened fracture
of the distal fibular shaft.

## 2017-05-30 IMAGING — DX DG ANKLE 2V *R*
2 series · 2 of 2 positions shown · non-contrast
Comparison: Pre reduction images

CLINICAL DATA: Status post reduction of the right ankle fracture
dislocation.

EXAM:
RIGHT ANKLE - 2 VIEW

[ankle ap]
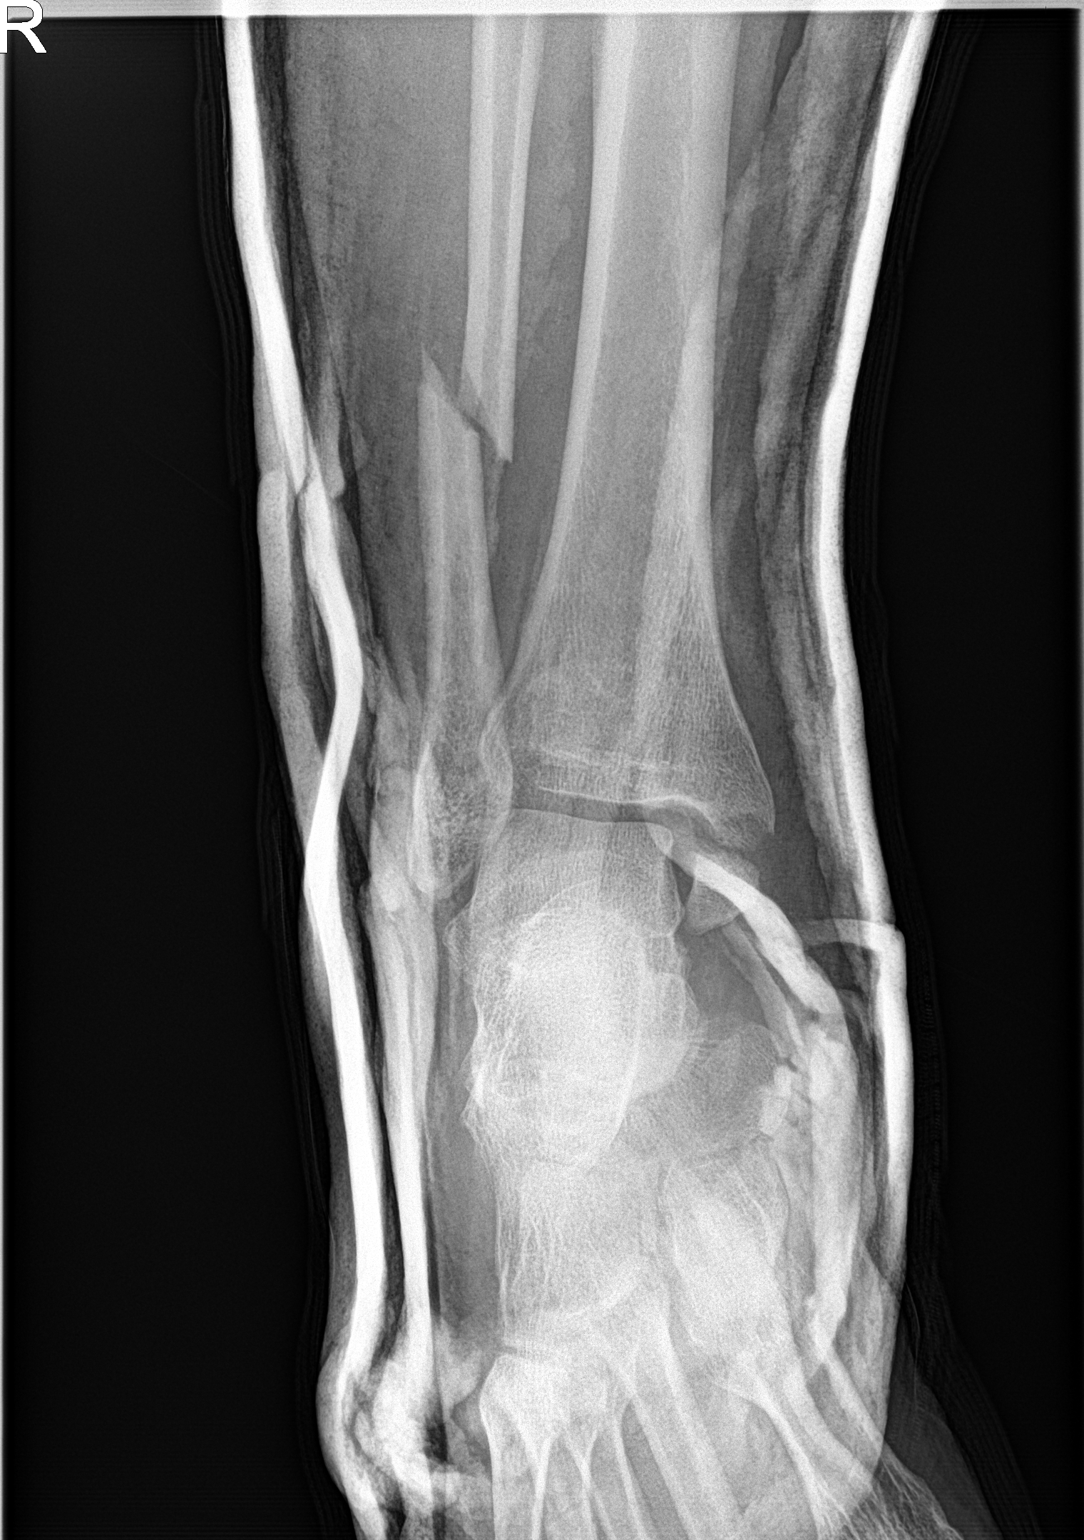

[ankle lat]
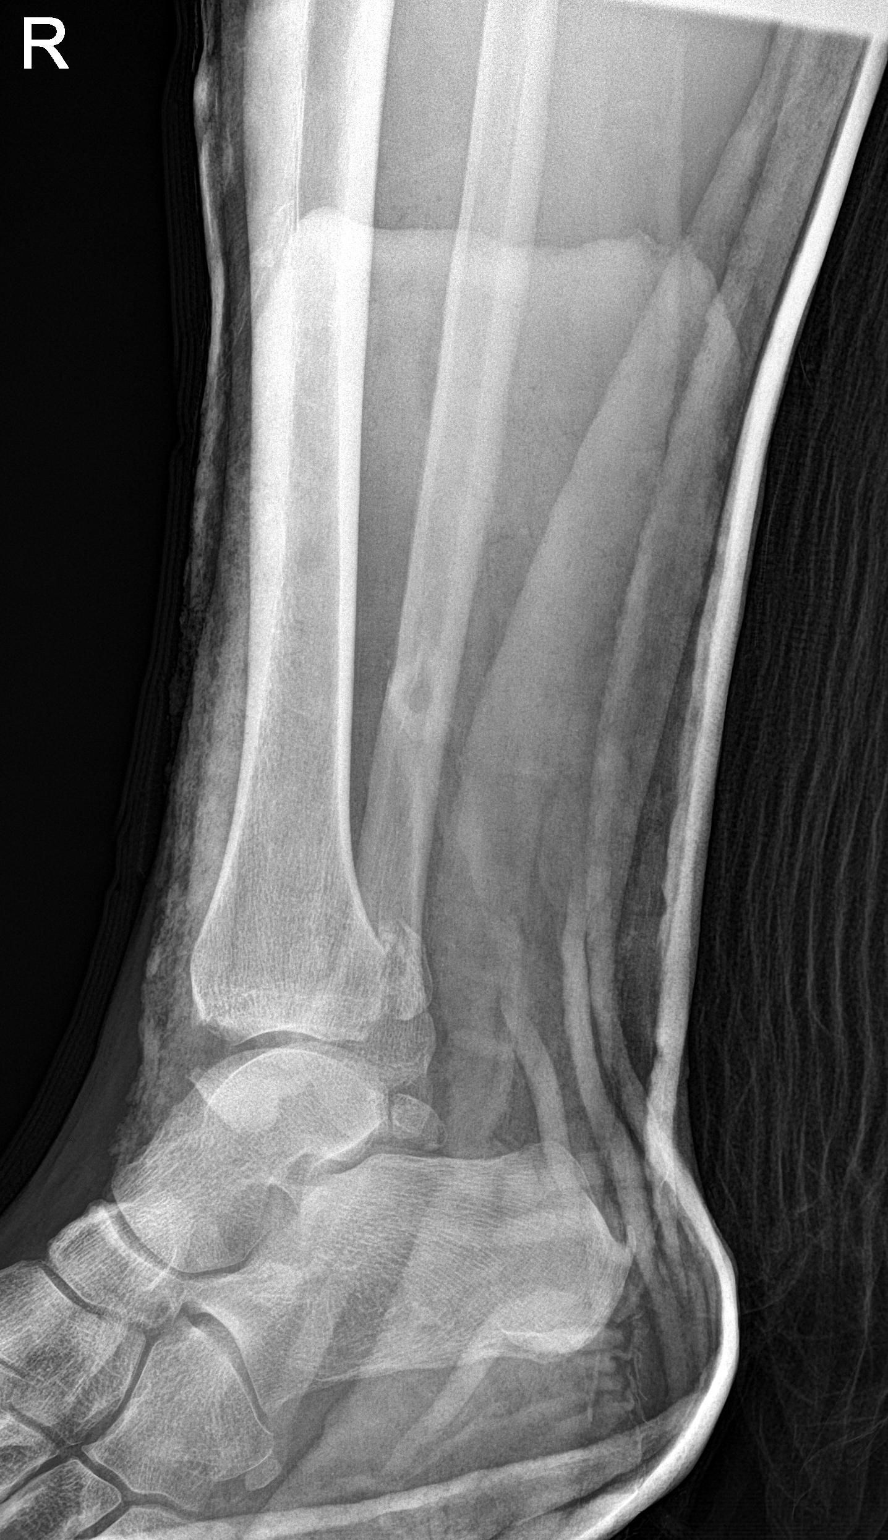

[2 of 2 positions shown; findings below may reference images not displayed]

FINDINGS: The talus has been reduced into near anatomic alignment tibial
articular surface. There is still mild lateral subluxation of the
talus measuring 3-4 mm.

Medial malleolar fracture there is now only mildly displaced, 5 mm
inferior and 3 mm lateral. The distal fibular fracture there is now
only foreshortened by 5 mm and displaced laterally by 7 mm. There is
also a posterior malleolar fracture, which is displaced superiorly
by 5 mm and posteriorly by 3 mm.
IMPRESSION: 1. There has been significant reduction. The talar dislocation has
been reduced with only 3-4 mm of lateral subluxation persisting.
Fractures have been partially reduced.

## 2019-04-03 ENCOUNTER — Ambulatory Visit: Payer: Self-pay | Attending: Family

## 2019-04-03 DIAGNOSIS — Z23 Encounter for immunization: Secondary | ICD-10-CM

## 2019-04-03 NOTE — Progress Notes (Signed)
   Covid-19 Vaccination Clinic  Name:  BUZZ AXEL    MRN: [id number] DOB: 07-31-1975  04/03/2019  Mr. Russom was observed post Covid-19 immunization for 30 minutes based on pre-vaccination screening without incident. He was provided with Vaccine Information Sheet and instruction to access the V-Safe system.   Mr. Dauphine was instructed to call 911 with any severe reactions post vaccine: Marland Kitchen Difficulty breathing  . Swelling of face and throat  . A fast heartbeat  . A bad rash all over body  . Dizziness and weakness   Immunizations Administered    Name Date Dose VIS Date Route   Moderna COVID-19 Vaccine 04/03/2019 10:59 AM 0.5 mL 12/10/2018 Intramuscular   Manufacturer: Moderna   Lot: [id number]   NDC: 36016-580-06

## 2019-05-06 ENCOUNTER — Ambulatory Visit: Payer: Self-pay | Attending: Family

## 2019-05-06 DIAGNOSIS — Z23 Encounter for immunization: Secondary | ICD-10-CM

## 2019-05-06 NOTE — Progress Notes (Signed)
   Covid-19 Vaccination Clinic  Name:  Ralph Levy    MRN: [id number] DOB: 25-Dec-1975  05/06/2019  Mr. Ralph Levy was observed post Covid-19 immunization for 15 minutes without incident. He was provided with Vaccine Information Sheet and instruction to access the V-Safe system.   Ralph Levy was instructed to call 911 with any severe reactions post vaccine: Marland Kitchen Difficulty breathing  . Swelling of face and throat  . A fast heartbeat  . A bad rash all over body  . Dizziness and weakness   Immunizations Administered    Name Date Dose VIS Date Route   Moderna COVID-19 Vaccine 05/06/2019 10:52 AM 0.5 mL 12/2018 Intramuscular   Manufacturer: Moderna   Lot: [id number]   NDC: 76808-811-03
